# Patient Record
Sex: Female | Born: 1990 | Race: Black or African American | Hispanic: No | Marital: Single | Smoking: Never smoker
Health system: Southern US, Community
[De-identification: ages and names within clinical notes are randomized; demographics above are authoritative.]

## PROBLEM LIST (undated history)

## (undated) ENCOUNTER — Inpatient Hospital Stay: Payer: Self-pay

## (undated) DIAGNOSIS — D573 Sickle-cell trait: Secondary | ICD-10-CM

---

## 2017-11-23 ENCOUNTER — Other Ambulatory Visit: Payer: Self-pay

## 2017-11-24 ENCOUNTER — Other Ambulatory Visit: Payer: Self-pay

## 2017-12-01 ENCOUNTER — Encounter: Payer: Self-pay | Admitting: Student

## 2017-12-14 ENCOUNTER — Other Ambulatory Visit: Payer: Self-pay

## 2017-12-14 ENCOUNTER — Observation Stay: Payer: Medicaid Other

## 2017-12-14 ENCOUNTER — Observation Stay
Admission: EM | Admit: 2017-12-14 | Discharge: 2017-12-14 | Disposition: A | Discharge status: Home / Self Care | Payer: Medicaid Other | Attending: Obstetrics and Gynecology | Admitting: Obstetrics and Gynecology

## 2017-12-14 DIAGNOSIS — O360130 Maternal care for anti-D [Rh] antibodies, third trimester, not applicable or unspecified: Secondary | ICD-10-CM

## 2017-12-14 DIAGNOSIS — O98813 Other maternal infectious and parasitic diseases complicating pregnancy, third trimester: Secondary | ICD-10-CM

## 2017-12-14 DIAGNOSIS — Z6791 Unspecified blood type, Rh negative: Secondary | ICD-10-CM

## 2017-12-14 DIAGNOSIS — O98313 Other infections with a predominantly sexual mode of transmission complicating pregnancy, third trimester: Secondary | ICD-10-CM | POA: Diagnosis not present

## 2017-12-14 DIAGNOSIS — Z880 Allergy status to penicillin: Secondary | ICD-10-CM | POA: Insufficient documentation

## 2017-12-14 DIAGNOSIS — O0933 Supervision of pregnancy with insufficient antenatal care, third trimester: Secondary | ICD-10-CM

## 2017-12-14 DIAGNOSIS — A749 Chlamydial infection, unspecified: Secondary | ICD-10-CM

## 2017-12-14 DIAGNOSIS — O26893 Other specified pregnancy related conditions, third trimester: Secondary | ICD-10-CM

## 2017-12-14 DIAGNOSIS — A568 Sexually transmitted chlamydial infection of other sites: Secondary | ICD-10-CM

## 2017-12-14 DIAGNOSIS — Z3A31 31 weeks gestation of pregnancy: Secondary | ICD-10-CM | POA: Insufficient documentation

## 2017-12-14 DIAGNOSIS — D573 Sickle-cell trait: Secondary | ICD-10-CM | POA: Insufficient documentation

## 2017-12-14 DIAGNOSIS — O99013 Anemia complicating pregnancy, third trimester: Secondary | ICD-10-CM | POA: Diagnosis not present

## 2017-12-14 DIAGNOSIS — O4693 Antepartum hemorrhage, unspecified, third trimester: Secondary | ICD-10-CM | POA: Diagnosis present

## 2017-12-14 HISTORY — DX: Sickle-cell trait: D57.3

## 2017-12-14 LAB — CHLAMYDIA/NGC RT PCR (ARMC ONLY)
CHLAMYDIA TR: DETECTED — AB
N gonorrhoeae: NOT DETECTED

## 2017-12-14 LAB — TYPE AND SCREEN
ABO/RH(D): O NEG
Antibody Screen: NEGATIVE

## 2017-12-14 LAB — URINE DRUG SCREEN, QUALITATIVE (ARMC ONLY)
Amphetamines, Ur Screen: NOT DETECTED
BENZODIAZEPINE, UR SCRN: NOT DETECTED
Barbiturates, Ur Screen: NOT DETECTED
Cannabinoid 50 Ng, Ur ~~LOC~~: NOT DETECTED
Cocaine Metabolite,Ur ~~LOC~~: NOT DETECTED
MDMA (ECSTASY) UR SCREEN: NOT DETECTED
METHADONE SCREEN, URINE: NOT DETECTED
Opiate, Ur Screen: NOT DETECTED
Phencyclidine (PCP) Ur S: NOT DETECTED
TRICYCLIC, UR SCREEN: NOT DETECTED

## 2017-12-14 LAB — CBC
HCT: 25.6 % — ABNORMAL LOW (ref 35.0–47.0)
HEMOGLOBIN: 8.4 g/dL — AB (ref 12.0–16.0)
MCH: 26.8 pg (ref 26.0–34.0)
MCHC: 32.9 g/dL (ref 32.0–36.0)
MCV: 81.5 fL (ref 80.0–100.0)
PLATELETS: 276 10*3/uL (ref 150–440)
RBC: 3.14 MIL/uL — AB (ref 3.80–5.20)
RDW: 15.6 % — ABNORMAL HIGH (ref 11.5–14.5)
WBC: 8.1 10*3/uL (ref 3.6–11.0)

## 2017-12-14 LAB — ABO/RH: ABO/RH(D): O NEG

## 2017-12-14 LAB — FETAL SCREEN: FETAL SCREEN: NEGATIVE

## 2017-12-14 LAB — HEMOGLOBIN A1C
HEMOGLOBIN A1C: 5.2 % (ref 4.8–5.6)
MEAN PLASMA GLUCOSE: 102.54 mg/dL

## 2017-12-14 MED ORDER — BETAMETHASONE SOD PHOS & ACET 6 (3-3) MG/ML IJ SUSP
12.0000 mg | INTRAMUSCULAR | Status: DC
  Administered 2017-12-14: 12 mg via INTRAMUSCULAR

## 2017-12-14 MED ORDER — AZITHROMYCIN 500 MG PO TABS
1000.0000 mg | ORAL_TABLET | Freq: Once | ORAL | Status: AC
  Administered 2017-12-14: 1000 mg via ORAL
  Filled 2017-12-14: qty 2

## 2017-12-14 MED ORDER — FERROUS SULFATE 325 (65 FE) MG PO TABS: 325.0000 mg | ORAL_TABLET | Freq: Every day | ORAL | 3 refills | Status: AC

## 2017-12-14 MED ORDER — PRENATAL GUMMIES/DHA & FA 0.4-32.5 MG PO CHEW: 1.0000 | CHEWABLE_TABLET | Freq: Every day | ORAL | 3 refills | Status: AC

## 2017-12-14 MED ORDER — RHO D IMMUNE GLOBULIN 1500 UNIT/2ML IJ SOSY
300.0000 ug | PREFILLED_SYRINGE | Freq: Once | INTRAMUSCULAR | Status: AC
  Administered 2017-12-14: 300 ug via INTRAMUSCULAR
  Filled 2017-12-14: qty 2

## 2017-12-14 MED ORDER — AZITHROMYCIN 500 MG PO TABS: 1000.0000 mg | ORAL_TABLET | Freq: Once | ORAL | 0 refills | Status: AC

## 2017-12-14 MED ORDER — RHO D IMMUNE GLOBULIN 1500 UNIT/2ML IJ SOSY
300.0000 ug | PREFILLED_SYRINGE | Freq: Once | INTRAMUSCULAR | Status: DC
  Filled 2017-12-14: qty 2

## 2017-12-14 NOTE — Discharge Summary (Addendum)
Physician Final Progress Note  Patient ID: Melissa Kirk MRN: [id number] DOB/AGE: 04-06-1991 27 y.o.  Admit date: 12/14/2017 Admitting provider: Conard Novak, MD Discharge date: 12/14/2017   Admission Diagnoses: vaginal bleeding 4 days ago No prenatal care/ unknown LMP  Discharge Diagnoses:  Active Problems:   Chlamydia infection affecting pregnancy in third trimester   No prenatal care in current pregnancy in third trimester   Rh negative state in antepartum period, third trimester   Anemia affecting pregnancy in third trimester   Sickle cell trait (HCC) 27 yo G3P1011 at [facility] by U/S today (EDC=02/09/2018), reactive NST, no prenatal care, Chlamydia infection, anemia, Rh negative (received Rhogam 12/14/17.  History of Present Illness: The patient is a 27 y.o. female G63P1011 who presents for vaginal bleeding/spotting on Friday (4 days ago). She knew that she was RH negative and would need Rhogam.  She has not had any prenatal care this pregnancy except [facility] month ago when she found out she was pregnant. She was told she was approximately 31 weeks at that time based on femur length with an EDD of 01/19/2018. Based on u/s today she has an EDD of 02/09/2018 and is 31 weeks 6days gestation (BPD 31wk 4d, HC 32wk 2d, AC 32wk6d, FL 31wk3d). Her AFI was normal at 17.5cm.  She denies any trauma to her abdomen during the pregnancy. She denies bleeding early in the pregnancy. She denies intercourse before the bleeding started on Friday. She describes the bleeding as spotting.  Placenta is remote from cervix and is anterior. She admits positive fetal movement. She denies leakage of fluid. She has had occasional contractions. She denies current use of tobacco, alcohol, recreational drugs. She admits to some heavy use of alcohol prior to knowing she was pregnant.  Prenatal labs done tonight.   Past Medical History:  Diagnosis Date  . Sickle cell trait (HCC)      History reviewed. No pertinent surgical history.  No current facility-administered medications on file prior to encounter.    No current outpatient medications on file prior to encounter.    Allergies  Allergen Reactions  . Augmentin [Amoxicillin-Pot Clavulanate] Hives    Social History   Socioeconomic History  . Marital status: Single    Spouse name: Not on file  . Number of children: Not on file  . Years of education: Not on file  . Highest education level: Not on file  Social Needs  . Financial resource strain: Not on file  . Food insecurity - worry: Not on file  . Food insecurity - inability: Not on file  . Transportation needs - medical: Not on file  . Transportation needs - non-medical: Not on file  Occupational History  . Not on file  Tobacco Use  . Smoking status: Never Smoker  . Smokeless tobacco: Never Used  Substance and Sexual Activity  . Alcohol use: Yes    Comment: not since found out pregnant   . Drug use: No  . Sexual activity: Not on file  Other Topics Concern  . Not on file  Social History Narrative  . Not on file    Physical Exam: BP 113/63   Pulse (!) 58   Temp 98.1 F (36.7 C) (Oral)   Resp 16   Ht 5\' 2"  (1.575 m)   Wt 61.2 kg (135 lb)   SpO2 99%   BMI 24.69 kg/m   Gen: NAD CV: RRR Pulm: CTAB Pelvic: 1/20/ballotable; repeat cervical exam prior to  discharge: internal os closed/ 4.95cm long on ultrasound/ -2 Toco: occasional contraction, irritability Fetal well being: Reactive NST 145 bpm baseline, moderate variability, +accelerations, -decelerations Ext: no edema, no evidence of DVT  Consults: None  Significant Findings/ Diagnostic Studies:  Results for orders placed or performed during the hospital encounter of 12/14/17 (from the past 24 hour(s))  Urine Drug Screen, Qualitative ([facility] only)     Status: None   Collection Time: 12/14/17  1:43 AM  Result Value Ref Range   Tricyclic, Ur Screen NONE DETECTED NONE DETECTED    Amphetamines, Ur Screen NONE DETECTED NONE DETECTED   MDMA (Ecstasy)Ur Screen NONE DETECTED NONE DETECTED   Cocaine Metabolite,Ur [place] NONE DETECTED NONE DETECTED   Opiate, Ur Screen NONE DETECTED NONE DETECTED   Phencyclidine (PCP) Ur S NONE DETECTED NONE DETECTED   Cannabinoid 50 Ng, Ur [place] NONE DETECTED NONE DETECTED   Barbiturates, Ur Screen NONE DETECTED NONE DETECTED   Benzodiazepine, Ur Scrn NONE DETECTED NONE DETECTED   Methadone Scn, Ur NONE DETECTED NONE DETECTED  Chlamydia/NGC rt PCR ([facility] only)     Status: Abnormal   Collection Time: 12/14/17  1:43 AM  Result Value Ref Range   Specimen source GC/Chlam URINE, RANDOM    Chlamydia Tr DETECTED (A) NOT DETECTED   N gonorrhoeae NOT DETECTED NOT DETECTED  CBC     Status: Abnormal   Collection Time: 12/14/17  2:17 AM  Result Value Ref Range   WBC 8.1 3.6 - 11.0 K/uL   RBC 3.14 (L) 3.80 - 5.20 MIL/uL   Hemoglobin 8.4 (L) 12.0 - 16.0 g/dL   HCT 40.3 (L) 47.4 - 25.9 %   MCV 81.5 80.0 - 100.0 fL   MCH 26.8 26.0 - 34.0 pg   MCHC 32.9 32.0 - 36.0 g/dL   RDW 56.3 (H) 87.5 - 64.3 %   Platelets 276 150 - 440 K/uL  ABO/Rh     Status: None   Collection Time: 12/14/17  2:17 AM  Result Value Ref Range   ABO/RH(D)      O NEG Performed at [facility], [address] [place], [place] [address]   Hemoglobin A1c     Status: None   Collection Time: 12/14/17  2:17 AM  Result Value Ref Range   Hgb A1c MFr Bld 5.2 4.8 - 5.6 %   Mean Plasma Glucose 102.54 mg/dL  Type and screen     Status: None   Collection Time: 12/14/17  4:35 AM  Result Value Ref Range   ABO/RH(D) O NEG    Antibody Screen NEG    Sample Expiration      12/17/2017 Performed at [facility], [address] [place], [place] [address]   Fetal screen     Status: None   Collection Time: 12/14/17  4:35 AM  Result Value Ref Range   Fetal Screen      NEG Performed at [facility], [address] [place], [place] [address]   Rhogam injection      Status: None (Preliminary result)   Collection Time: 12/14/17  4:35 AM  Result Value Ref Range   Unit Number [id number]    Blood Component Type RHIG    Unit division 00    Status of Unit ISSUED    Transfusion Status      OK TO TRANSFUSE Performed at [facility], [address] [place], [place] [address]    Ultrasound/anatomy scan (preliminary report only available)  A: IUP at [facility]  with no prenatal care Spotting probably due to Chlamydia infection Anemia/ sickle cell trait. R/O iron deficiency Rh negative  P: Azithromycin 1 GM given to patient. Partner needs treatment (Azithromycin RX given to partner). No intercourse x 1 week Betamethasone today and tomorrow incase of preterm delivery Rhogam given today RX for prenatal gummies and ferrous sulfate [facility].  Procedures: NST  Discharge Condition: good  Disposition: Discharge to home/self care  Diet: Regular diet  Discharge Activity: Activity as tolerated  Discharge Instructions    Discharge activity:  No Restrictions   Complete by:  As directed    Discharge diet:  No restrictions   Complete by:  As directed    Discharge patient   Complete by:  As directed    Discharge disposition:  01-Home or Self Care   Discharge patient date:  12/14/2017   Fetal Kick Count:  Lie on our left side for one hour after a meal, and count the number of times your baby kicks.  If it is less than 5 times, get up, move around and drink some juice.  Repeat the test 30 minutes later.  If it is still less than 5 kicks in an hour, notify your doctor.   Complete by:  As directed    No sexual activity restrictions   Complete by:  As directed    Notify physician for a general feeling that "something is not right"   Complete by:  As directed    Notify physician for increase or change in vaginal discharge   Complete by:  As directed    Notify physician for intestinal cramps, with or without  diarrhea, sometimes described as "gas pain"   Complete by:  As directed    Notify physician for leaking of fluid   Complete by:  As directed    Notify physician for low, dull backache, unrelieved by heat or Tylenol   Complete by:  As directed    Notify physician for menstrual like cramps   Complete by:  As directed    Notify physician for pelvic pressure   Complete by:  As directed    Notify physician for uterine contractions.  These may be painless and feel like the uterus is tightening or the baby is  "balling up"   Complete by:  As directed    Notify physician for vaginal bleeding   Complete by:  As directed    PRETERM LABOR:  Includes any of the follwing symptoms that occur between 20 - [redacted] weeks gestation.  If these symptoms are not stopped, preterm labor can result in preterm delivery, placing your baby at risk   Complete by:  As directed      Medications: Take prenatal vitamin with iron  Rhogam IM given prior to discharge  Follow-up Information    [facility]. Schedule an appointment as soon as possible for a visit on 12/16/2017.   Specialty:  Obstetrics and Gynecology Why:  at 2:00pm with Horace PorteousJane, CNM Contact information: [address] [place] [phone]       [facility] Follow up on 12/15/2017.   Why:   at 9am for second injection. Contact information: [address] [id number] [place] [id number] [phone]          Total time spent taking care of this patient: 50 minutes  Signed: Farrel Connersolleen Priyanka Causey, CNM  12/14/2017, 10:42 AM

## 2017-12-14 NOTE — Discharge Instructions (Signed)
Talk all medications as prescribed. Drink plenty of fluid and get plenty of rest. Keep your scheduled OB appointment Thursday at 1pm. Call your provider for any other concerns

## 2017-12-15 ENCOUNTER — Inpatient Hospital Stay
Admission: AD | Admit: 2017-12-15 | Discharge: 2017-12-15 | Disposition: A | Discharge status: Home / Self Care | Payer: Medicaid Other | Source: Ambulatory Visit | Attending: Obstetrics and Gynecology | Admitting: Obstetrics and Gynecology

## 2017-12-15 DIAGNOSIS — Z3A00 Weeks of gestation of pregnancy not specified: Secondary | ICD-10-CM | POA: Insufficient documentation

## 2017-12-15 LAB — RHOGAM INJECTION: Unit division: 0

## 2017-12-15 LAB — URINE CULTURE: Culture: NO GROWTH

## 2017-12-15 LAB — HEPATITIS B SURFACE ANTIGEN: HEP B S AG: NEGATIVE

## 2017-12-15 LAB — RUBELLA SCREEN: RUBELLA: 1.51 {index} (ref >0.99)

## 2017-12-15 LAB — VARICELLA ZOSTER ANTIBODY, IGG: Varicella IgG: 1293 index (ref >165)

## 2017-12-15 LAB — HIV ANTIBODY (ROUTINE TESTING W REFLEX): HIV SCREEN 4TH GENERATION: NONREACTIVE

## 2017-12-15 LAB — RPR: RPR: NONREACTIVE

## 2017-12-15 MED ORDER — BETAMETHASONE SOD PHOS & ACET 6 (3-3) MG/ML IJ SUSP
INTRAMUSCULAR | Status: AC
  Administered 2017-12-15: 12 mg
  Filled 2017-12-15: qty 1

## 2017-12-16 ENCOUNTER — Encounter: Payer: Self-pay | Admitting: Advanced Practice Midwife

## 2017-12-16 LAB — CULTURE, BETA STREP (GROUP B ONLY)

## 2018-05-04 ENCOUNTER — Encounter (HOSPITAL_COMMUNITY): Payer: Self-pay

## 2018-08-15 IMAGING — US US OB COMP +14 WK
1 series · 13 of 28 positions shown · non-contrast
Comparison: none

CLINICAL DATA: No prenatal care.

EXAM:
OBSTETRICAL ULTRASOUND >14 WKS
CLINICAL DATA: No prenatal care.  Evaluate for placental location.
LIMITED OBSTETRIC ULTRASOUND

[Series 1: us ob comp +14 wk · 13 of 94 slices shown]
[im 4/94]
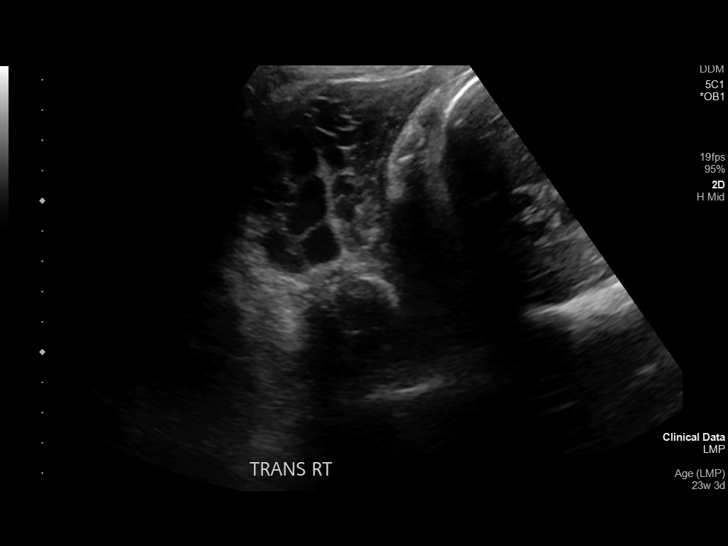
[im 11/94]
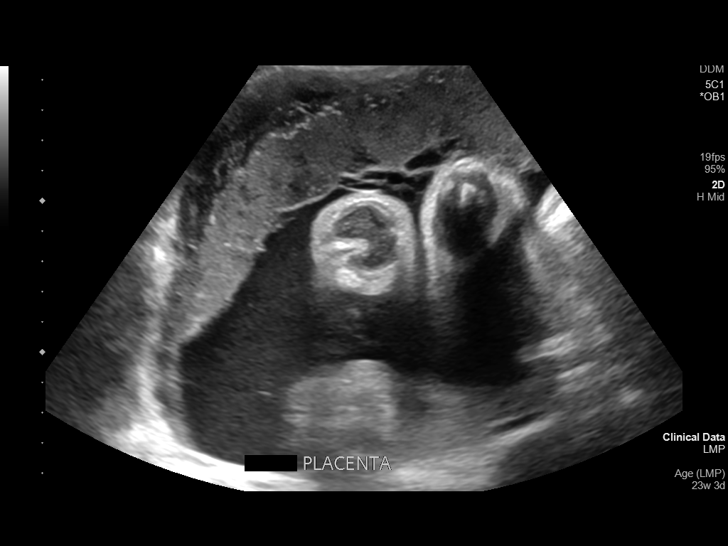
[im 18/94]
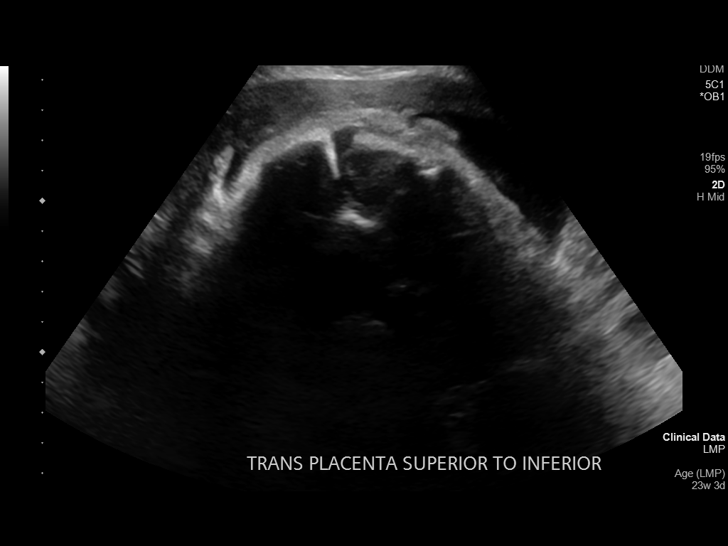
[im 25/94]
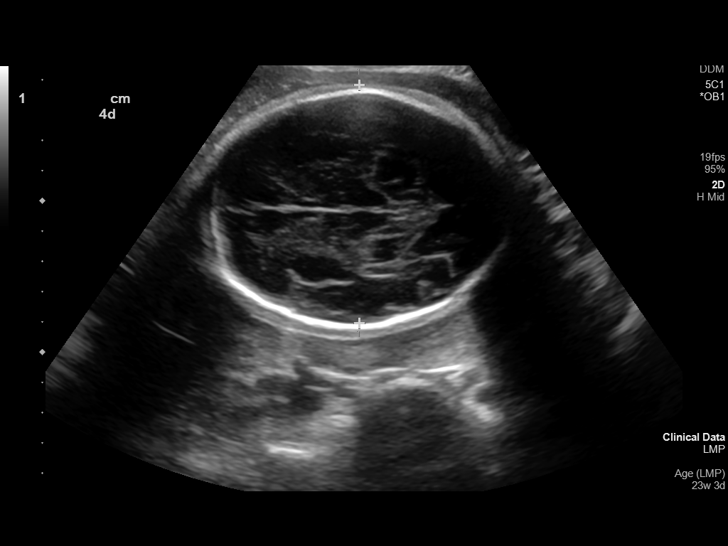
[im 32/94]
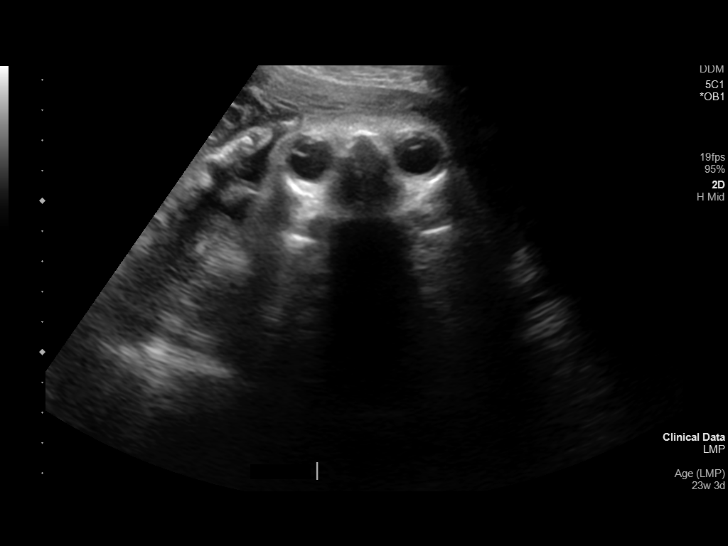
[im 38/94]
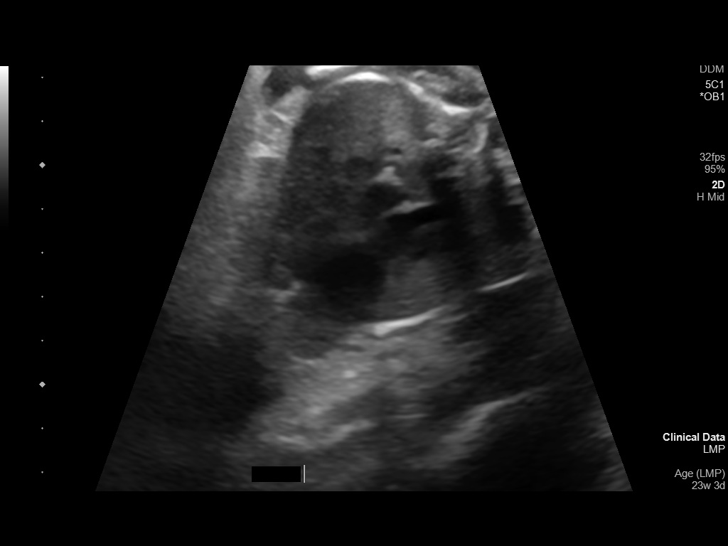
[im 49/94]
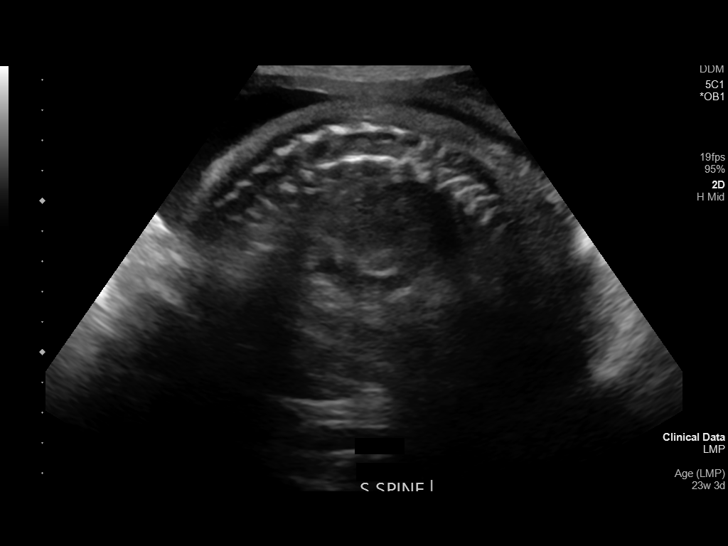
[im 56/94]
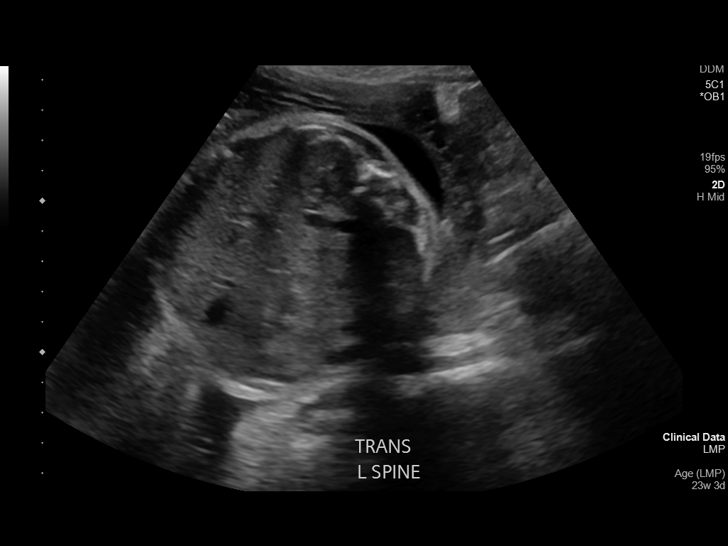
[im 63/94]
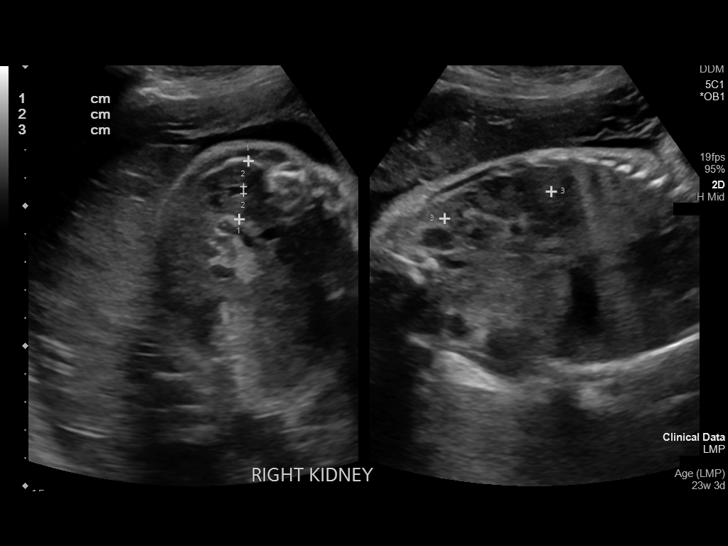
[im 69/94]
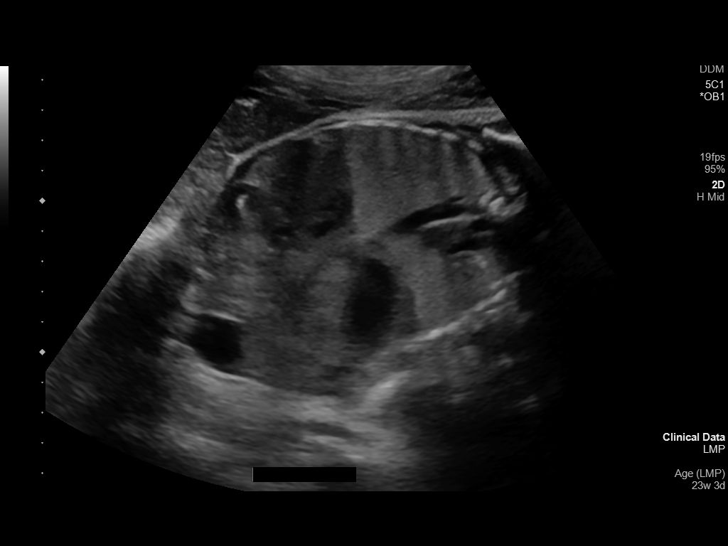
[im 76/94]
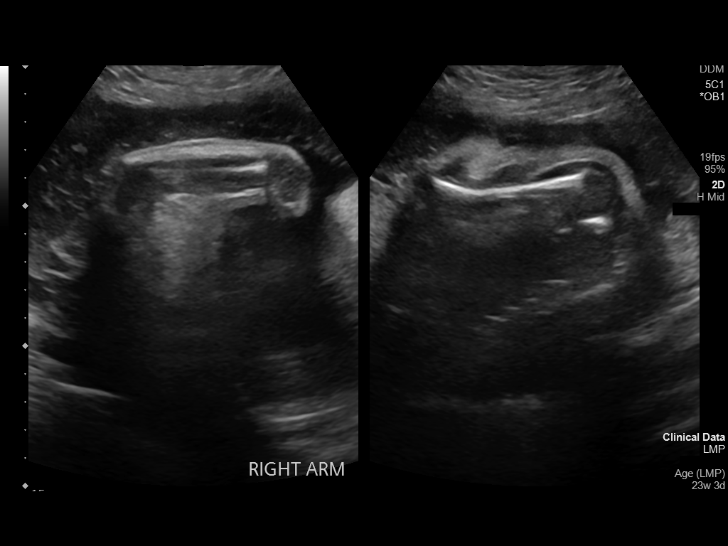
[im 83/94]
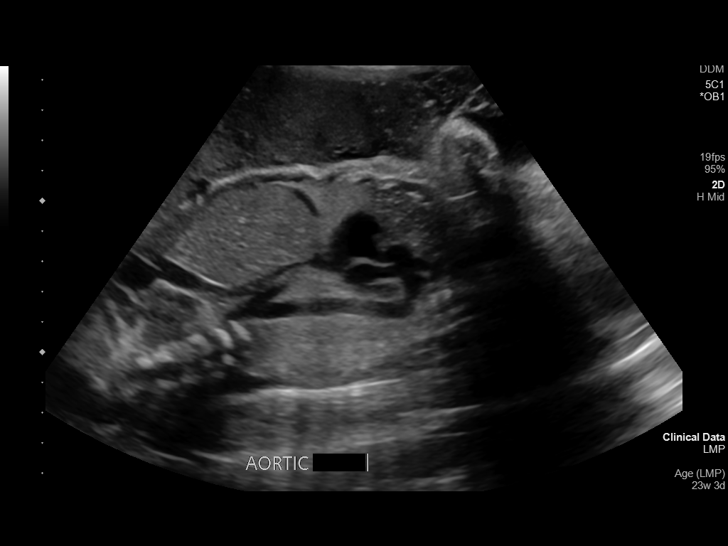
[im 90/94]
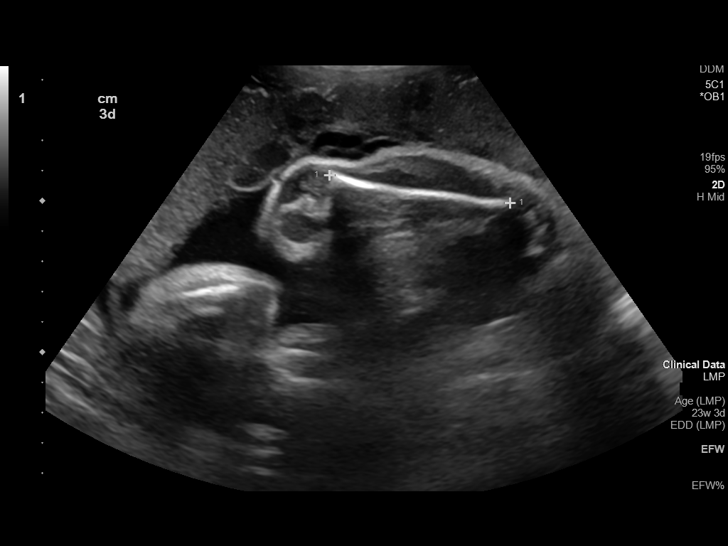

[13 of 28 positions shown; findings below may reference images not displayed]

FINDINGS: Number of Fetuses: 1

Heart Rate:  155 bpm

Movement: Yes

Presentation: Cephalic

Previa: No

Placental Location: Anterior.  10.4 cm from the cervical os.

Amniotic Fluid (Subjective): Normal

Amniotic Fluid (Objective):

Vertical pocket 6.1cm

AFI 17.5 cm

FETAL BIOMETRY

BPD:  7.9cm 31w 4d

HC:    29.3cm 32w 2d

AC:   28.8cm 32w 6d

FL:   6cm 31w 3d

Current Mean GA: 31w 6d US EDC: 02/09/2018

Estimated Fetal Weight:  1930g

FETAL ANATOMY

Lateral Ventricles: Appears normal

Thalami/CSP: Appears normal

Posterior Fossa:  Appears normal

Nuchal Region: Appears normal

Upper Lip: Appears normal

Spine: Appears normal

4 Chamber Heart on Left: Appears normal

LVOT: Appears normal

RVOT: Appears normal

Stomach on Left: Appears normal

3 Vessel Cord: Appears normal

Cord Insertion site: Appears normal

Kidneys: Appears normal

Bladder: Appears normal

Extremities: Appears normal

Maternal Findings:

Cervix:  Cervix is closed.
IMPRESSION: Single live intrauterine pregnancy as detailed above. No fetal
abnormalities were identified.
FINDINGS: Number of Fetuses: 1

Heart Rate:  140 bpm

Movement: Yes

Presentation: Cephalic

Placental Location: Anterior

Previa: No

Amniotic Fluid (Subjective):  Within normal limits.

Amniotic fluid index (AFI): 15.1 cm (50th percentile)

BPD: 8  cm 32 w  1 d

MATERNAL FINDINGS:

Cervix:  Appears closed.

Uterus/Adnexae: No abnormality visualized.
IMPRESSION: Single live intrauterine pregnancy as described above.

This exam is performed on an emergent basis and does not
comprehensively evaluate fetal size, dating, or anatomy; follow-up
complete OB US should be considered if further fetal assessment is
warranted.

## 2018-09-23 IMAGING — US US OB LIMITED
1 series · 14 of 28 positions shown · non-contrast
Comparison: none

CLINICAL DATA: Pregnant patient in third trimester pregnancy with
no prenatal care.

EXAM:
LIMITED OBSTETRIC ULTRASOUND

[Series 1: us ob limited · 0.31mm/px · 14 of 28 slices shown]
[im 2/28]
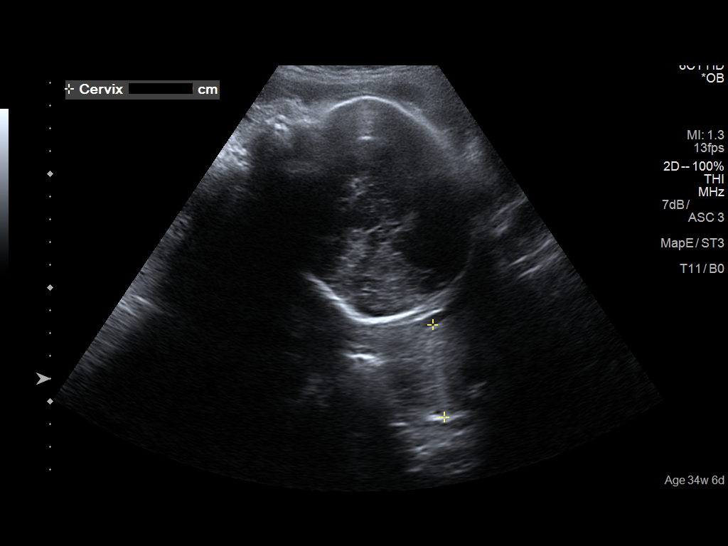
[im 4/28]
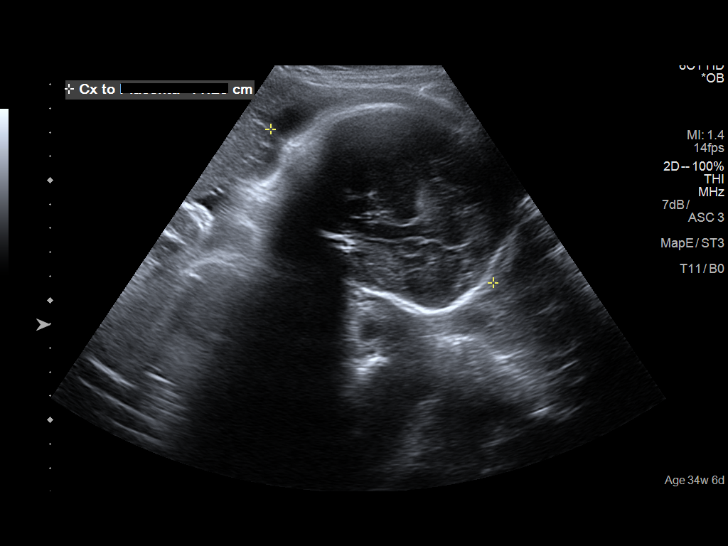
[im 6/28]
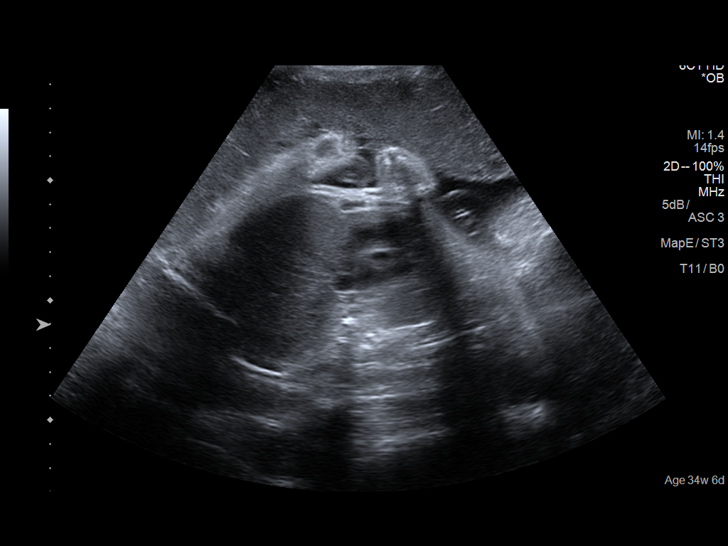
[im 8/28]
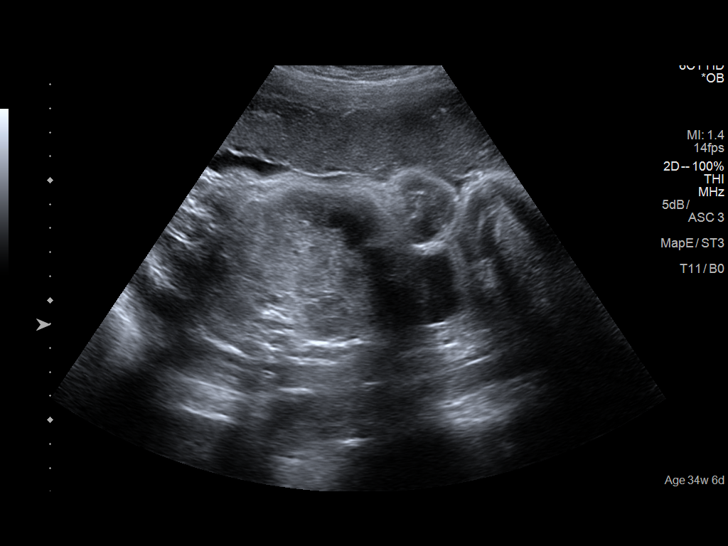
[im 10/28]
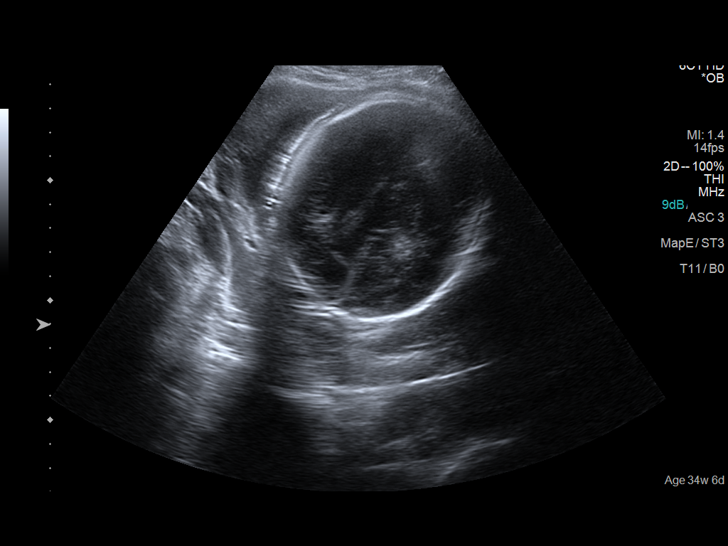
[im 12/28]
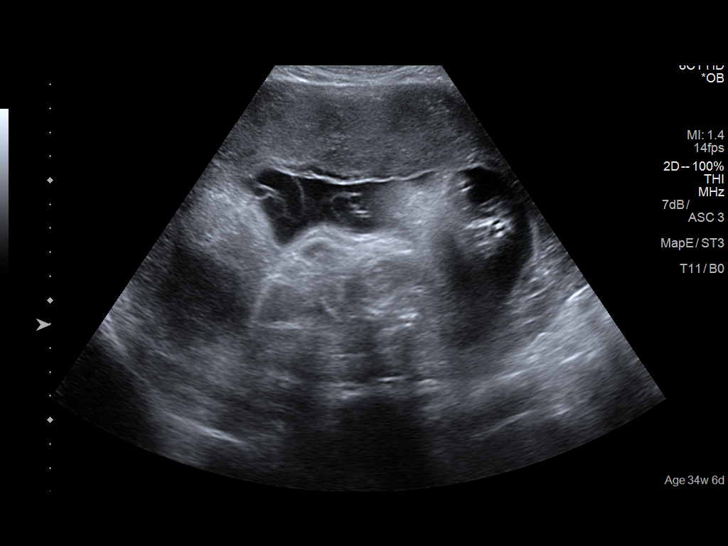
[im 14/28]
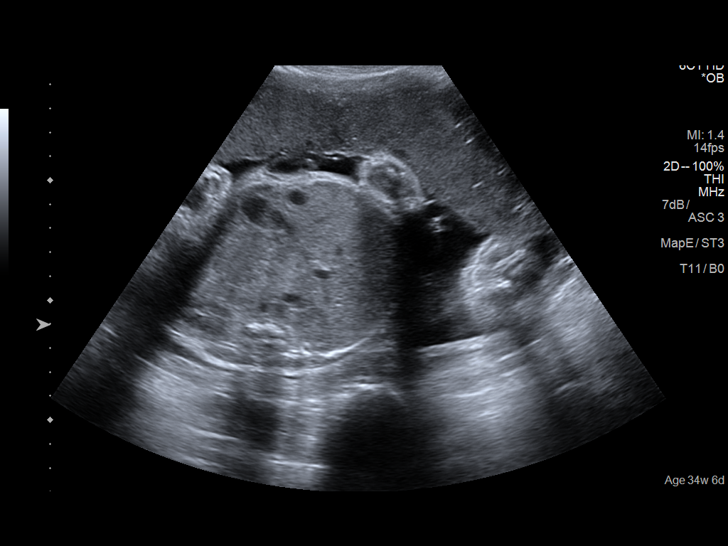
[im 16/28]
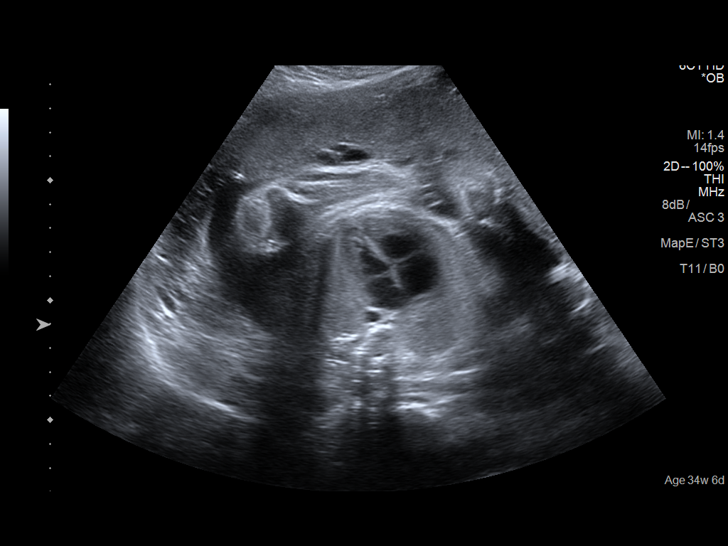
[im 18/28]
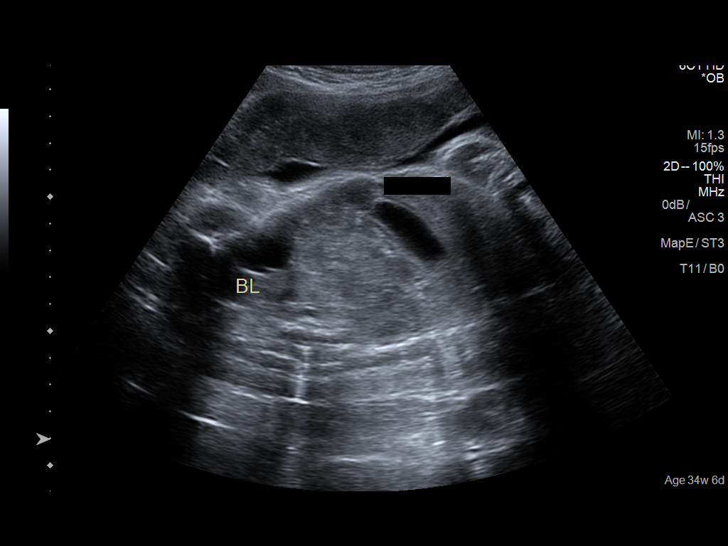
[im 20/28]
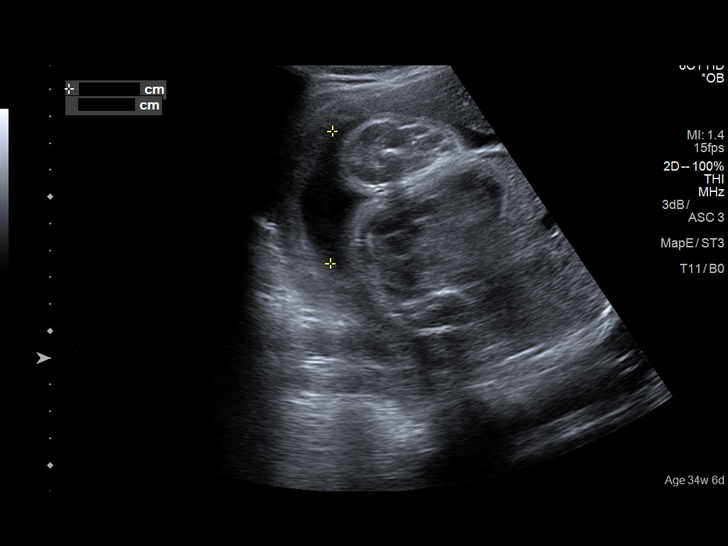
[im 22/28]
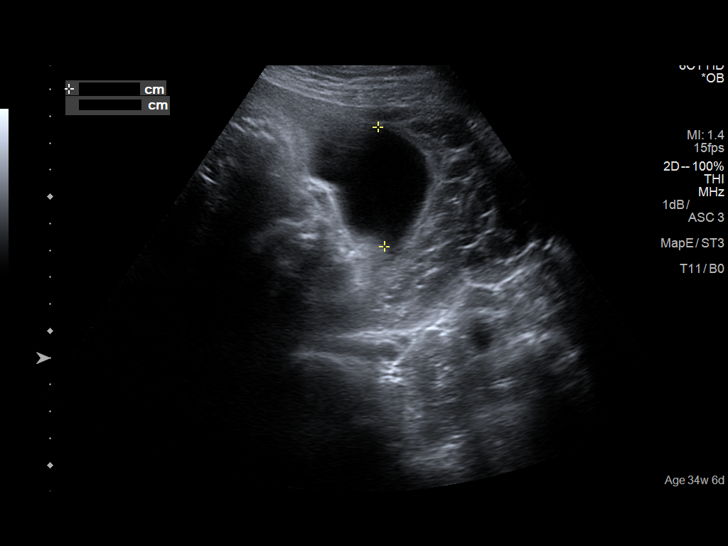
[im 24/28]
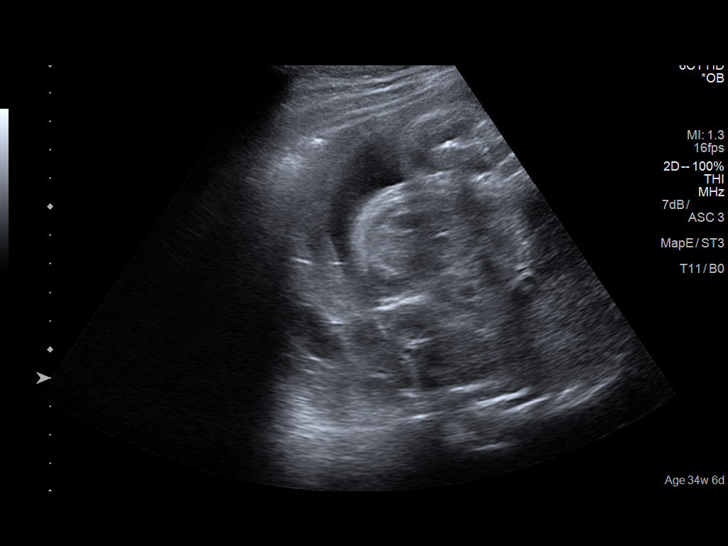
[im 26/28]
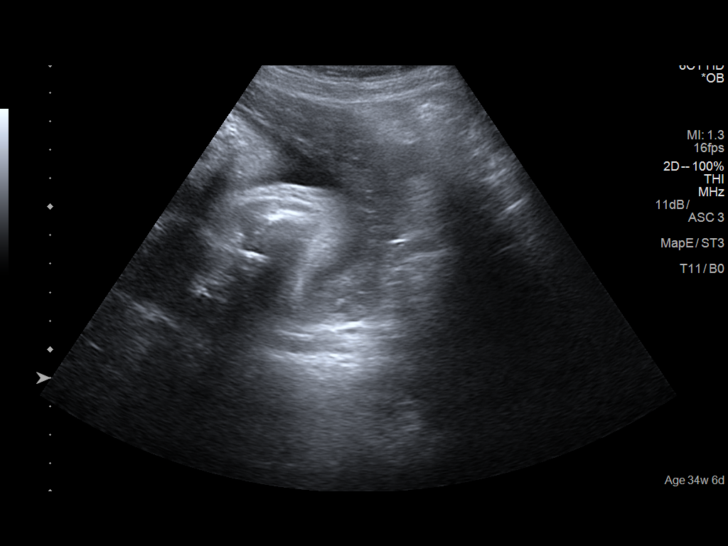
[im 28/28]
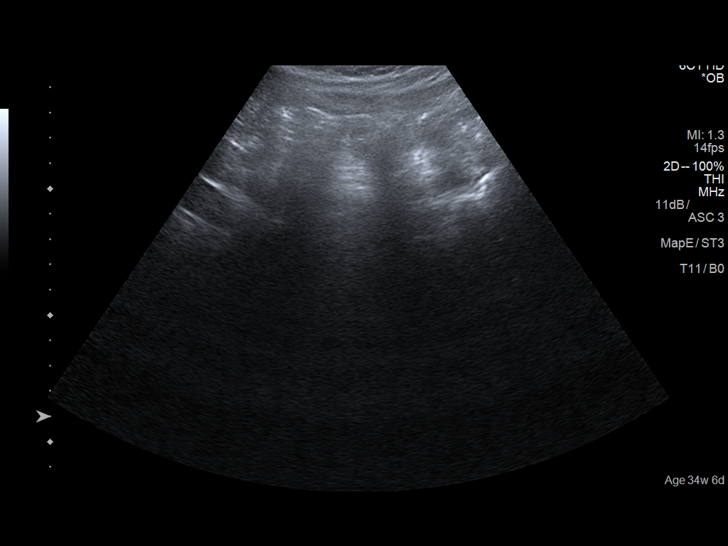

[14 of 28 positions shown; findings below may reference images not displayed]

FINDINGS: Number of Fetuses: 1

Heart Rate:  140 bpm

Movement: Yes

Presentation: Cephalic

Placental Location: Anterior.

Previa: No

Amniotic Fluid (Subjective):  Within normal limits.

BPD: 8.0 cm 32 w  1 d

MATERNAL FINDINGS:

Cervix:  Appears closed.

Uterus/Adnexae: No abnormality visualized. Neither ovary is
visualized.
IMPRESSION: Single viable intrauterine gestation estimated gestational age 32
weeks 1 day based on biparietal diameter. Placenta anterior without
previa. Normal amniotic fluid volume.

This exam is performed on an emergent basis and does not
comprehensively evaluate fetal size, dating, or anatomy; follow-up
complete OB US should be considered if further fetal assessment is
warranted.
